# Patient Record
Sex: Male | Born: 1985 | Hispanic: Yes | Marital: Single | Smoking: Former smoker
Health system: Southern US, Community
[De-identification: ages and names within clinical notes are randomized; demographics above are authoritative.]

## PROBLEM LIST (undated history)

## (undated) DIAGNOSIS — N433 Hydrocele, unspecified: Secondary | ICD-10-CM

## (undated) HISTORY — DX: Hydrocele, unspecified: N43.3

---

## 2013-04-09 ENCOUNTER — Emergency Department: Payer: Self-pay | Admitting: Unknown Physician Specialty

## 2013-04-09 LAB — URINALYSIS, COMPLETE
Bacteria: NONE SEEN
Bilirubin,UR: NEGATIVE
Blood: NEGATIVE
Glucose,UR: NEGATIVE mg/dL (ref 0–75)
Ketone: NEGATIVE
Nitrite: NEGATIVE
RBC,UR: 1 /HPF (ref 0–5)
Specific Gravity: 1.016 (ref 1.003–1.030)

## 2013-04-09 LAB — GC/CHLAMYDIA PROBE AMP

## 2013-04-10 ENCOUNTER — Emergency Department: Payer: Self-pay | Admitting: Emergency Medicine

## 2013-04-10 LAB — URINE CULTURE

## 2013-09-24 ENCOUNTER — Emergency Department: Payer: Self-pay | Admitting: Emergency Medicine

## 2013-09-24 LAB — URINALYSIS, COMPLETE
Bilirubin,UR: NEGATIVE
Blood: NEGATIVE
Glucose,UR: NEGATIVE mg/dL (ref 0–75)
Ketone: NEGATIVE
Leukocyte Esterase: NEGATIVE
Nitrite: NEGATIVE
Protein: NEGATIVE
RBC,UR: <1 /HPF (ref 0–5)
Specific Gravity: 1.02 (ref 1.003–1.030)
Squamous Epithelial: NONE SEEN
WBC UR: 1 /HPF (ref 0–5)

## 2013-09-26 LAB — URINE CULTURE

## 2014-06-03 IMAGING — US US PELVIS LIMITED
1 series · 14 of 25 positions shown · non-contrast
Comparison: none

REASON FOR EXAM: testicular pain
COMMENTS:

[Series 1: us pelvis limited · 0.08mm/px · 14 of 60 slices shown]
[im 1/60]
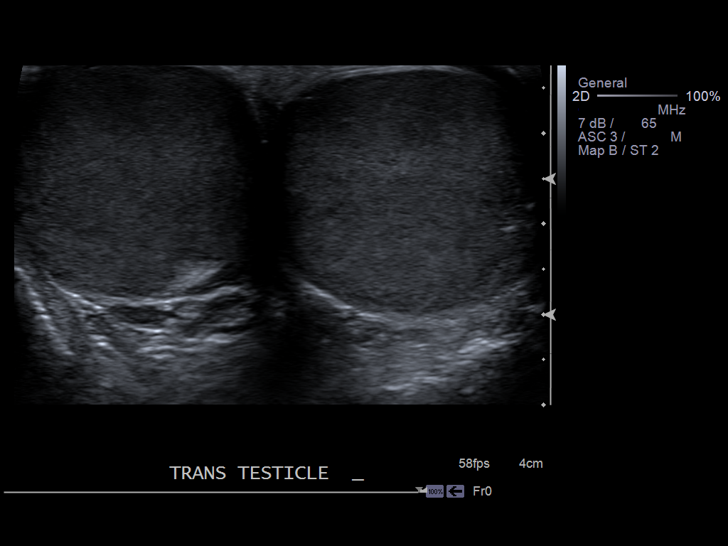
[im 5/60]
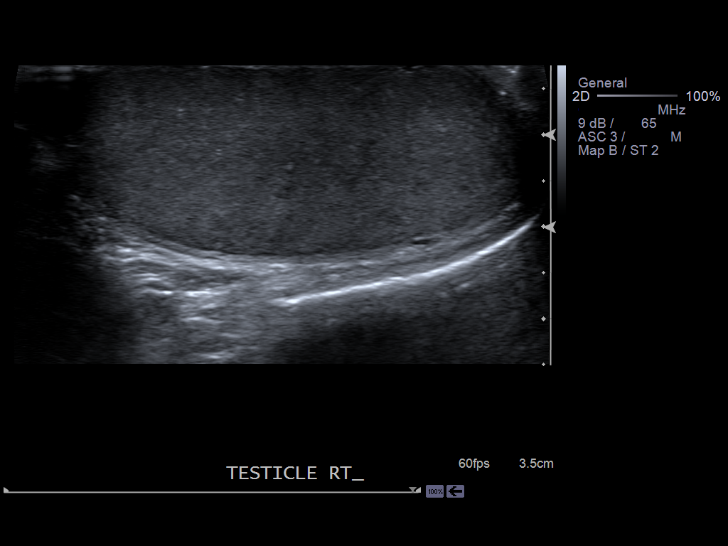
[im 10/60]
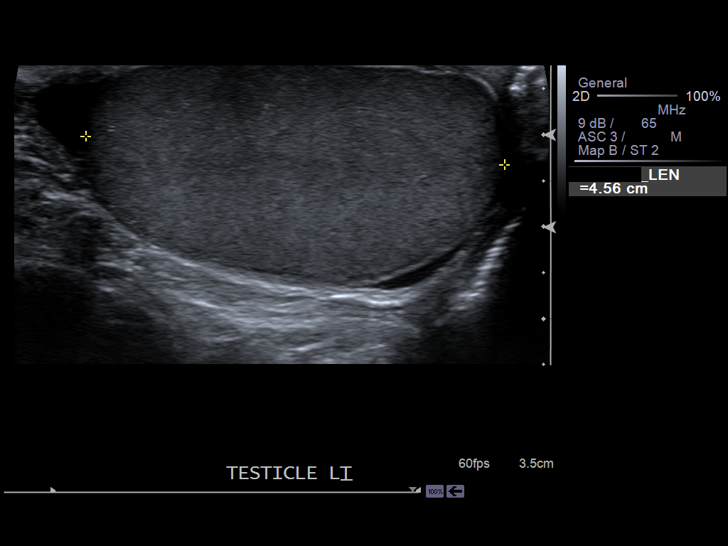
[im 15/60]
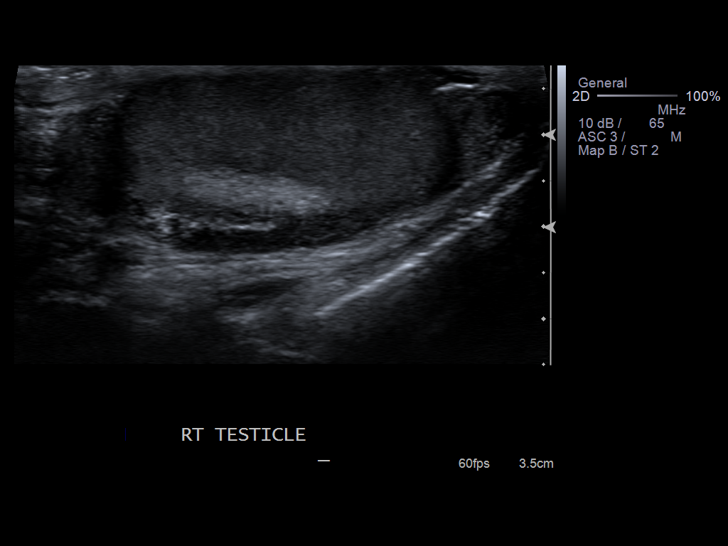
[im 20/60]
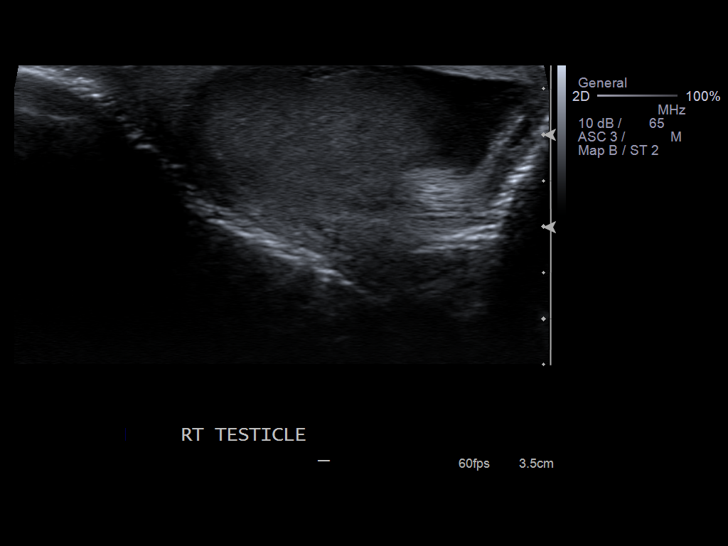
[im 23/60]
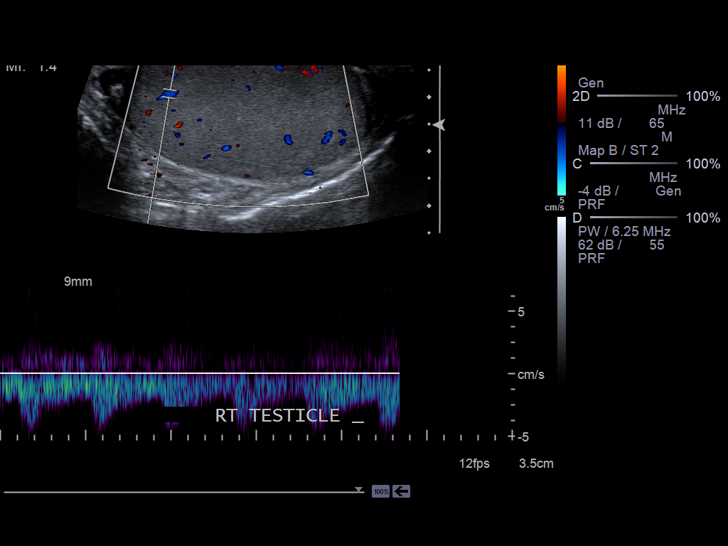
[im 28/60]
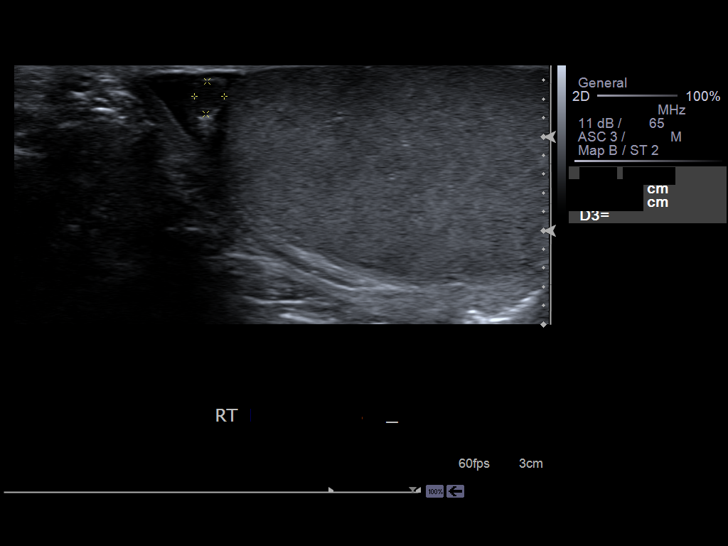
[im 32/60]
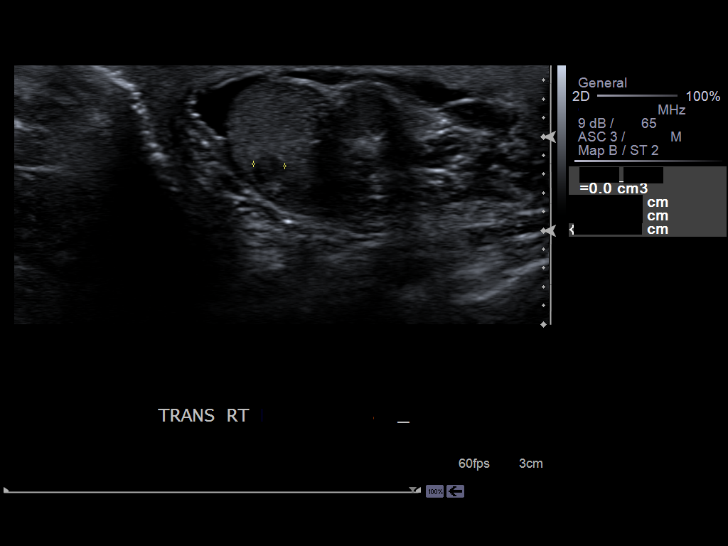
[im 37/60]
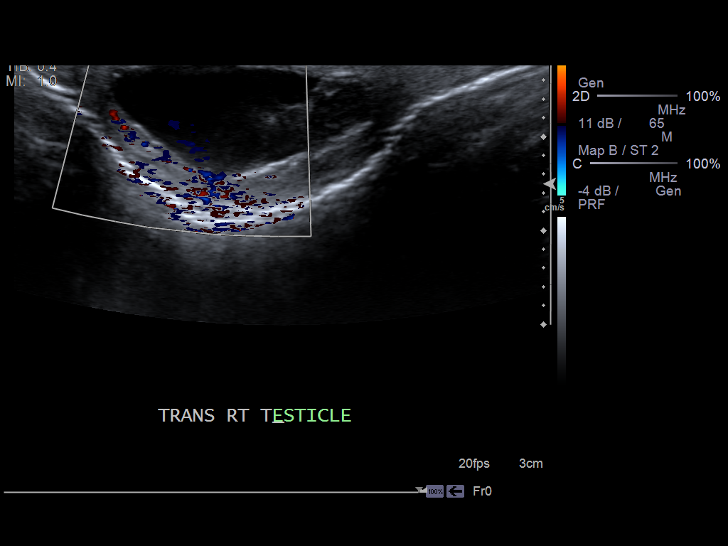
[im 40/60]
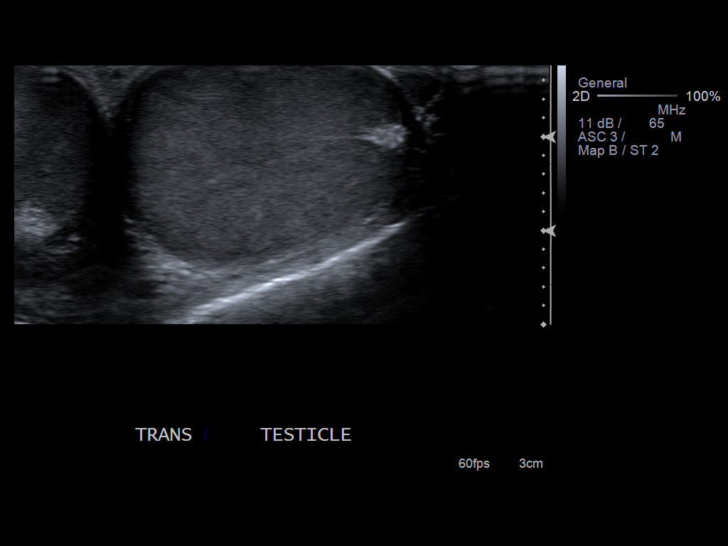
[im 45/60]
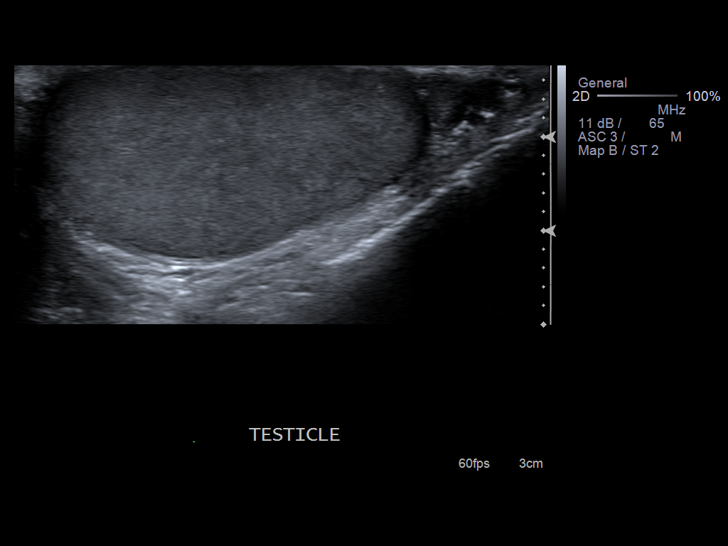
[im 50/60]
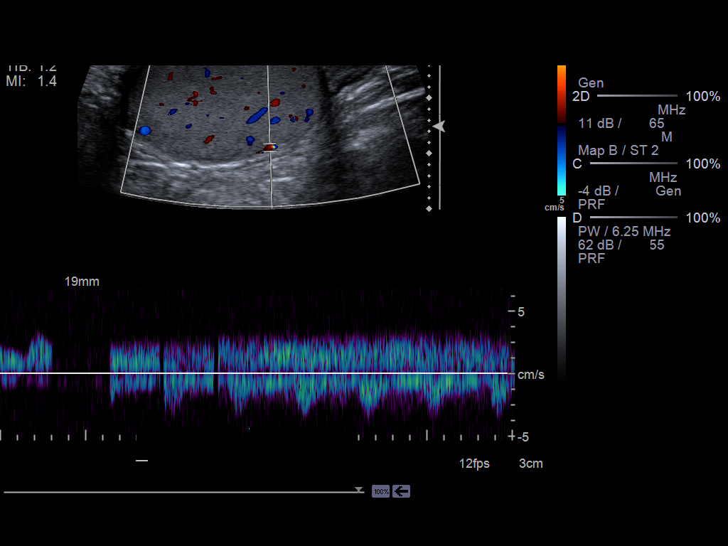
[im 55/60]
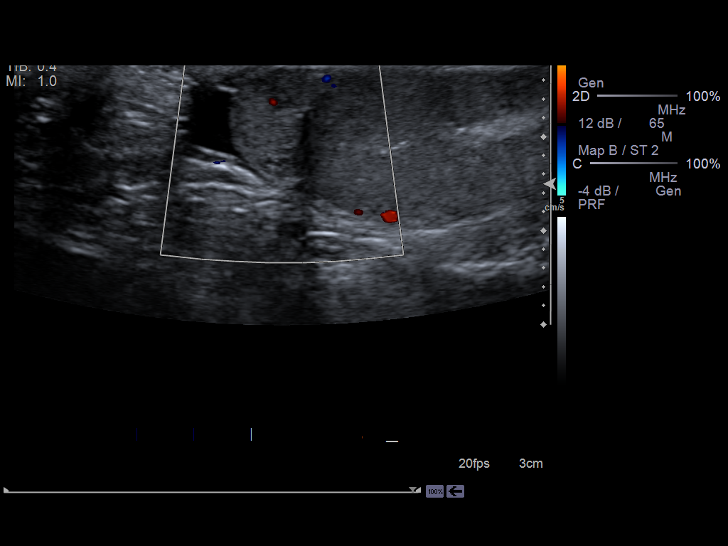
[im 60/60]
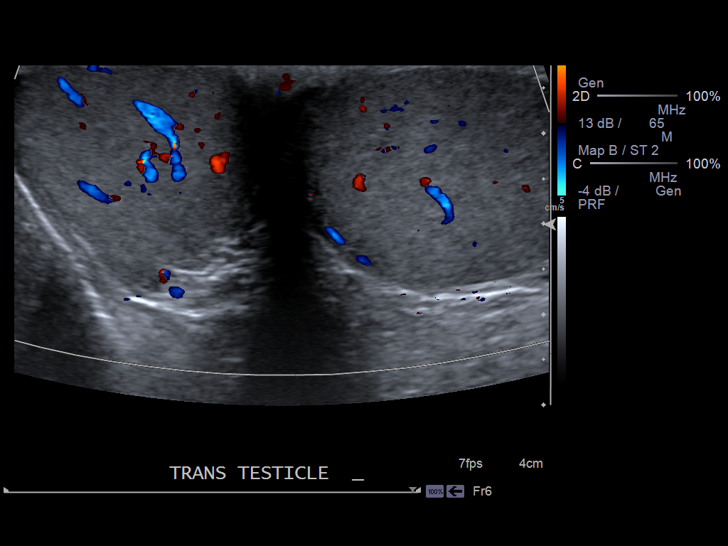

[14 of 25 positions shown; findings below may reference images not displayed]

PROCEDURE:     US  - US TESTICULAR  - April 10, 2013 [DATE]

RESULT:     Grayscale and color flow Doppler techniques were employed to
evaluate the testes.

The testes exhibit normal echotexture and vascularity. The right testicle
measures 4.7 x 2.4 x 2.9 cm. The left testicle measures 4.6 x 2.4 x 3 cm.
The epididymal structures exhibit normal vascularity. There is an
approximately 3 mm diameter epididymal cyst on the right. There are small
bilateral hydroceles.
IMPRESSION: 1. There is no evidence of testicular or epididymal torsion.
2. There are no findings to suggest acute epididymitis. There is a small
cyst associated with the right epididymis.
3. There are small hydroceles bilaterally.

[REDACTED]

## 2021-05-19 ENCOUNTER — Emergency Department
Admission: EM | Admit: 2021-05-19 | Discharge: 2021-05-19 | Disposition: A | Discharge status: Home / Self Care | Payer: Self-pay | Attending: Emergency Medicine | Admitting: Emergency Medicine

## 2021-05-19 ENCOUNTER — Encounter: Payer: Self-pay | Admitting: Emergency Medicine

## 2021-05-19 ENCOUNTER — Emergency Department: Payer: Self-pay

## 2021-05-19 ENCOUNTER — Other Ambulatory Visit: Payer: Self-pay

## 2021-05-19 DIAGNOSIS — N50811 Right testicular pain: Secondary | ICD-10-CM

## 2021-05-19 DIAGNOSIS — N433 Hydrocele, unspecified: Secondary | ICD-10-CM | POA: Insufficient documentation

## 2021-05-19 LAB — CHLAMYDIA/NGC RT PCR (ARMC ONLY)
Chlamydia Tr: NOT DETECTED
N gonorrhoeae: NOT DETECTED

## 2021-05-19 LAB — URINALYSIS, COMPLETE (UACMP) WITH MICROSCOPIC
Bacteria, UA: NONE SEEN
Bilirubin Urine: NEGATIVE
Glucose, UA: NEGATIVE mg/dL
Hgb urine dipstick: NEGATIVE
Ketones, ur: NEGATIVE mg/dL
Leukocytes,Ua: NEGATIVE
Nitrite: NEGATIVE
Protein, ur: NEGATIVE mg/dL
Specific Gravity, Urine: 1.014 (ref 1.005–1.030)
Squamous Epithelial / HPF: NONE SEEN (ref 0–5)
pH: 6 (ref 5.0–8.0)

## 2021-05-19 NOTE — ED Notes (Signed)
See triage note  States he developed some penile discharge about 1 week ago with some lower abd discomfort  No fever

## 2021-05-19 NOTE — ED Provider Notes (Signed)
[facility] Emergency Department Provider Note ____________________________________________  Time seen: 1100  I have reviewed the triage vital signs and the nursing notes.  HISTORY  Chief Complaint  Penile Discharge and SEXUALLY TRANSMITTED DISEASE   HPI Brendan Walsh is a 35 y.o. male presents to the ER today with complaint of penile discharge.  He reports this started 5 days ago.  He reports the discharge is white in color.  He does report some dysuria but denies urinary urgency, frequency, retention or blood in his urine.  He does have some lower abdominal pain intermittently but no low back pain.  He denies penile lesions.  He reports intermittent bilateral groin pain and intermittent testicular pain that seems worse with movement but no testicular swelling.  He denies fever, chills, body aches, nausea, vomiting or diarrhea.  He is concerned that he may have an STD because he had sexual intercourse with a new partner 1 week ago.  He reports she denied having any sexually transmitted diseases.  He has not tried anything OTC for his symptoms.  History reviewed. No pertinent past medical history.  There are no problems to display for this patient.   History reviewed. No pertinent surgical history.  Prior to Admission medications   Not on File    Allergies Patient has no allergy information on record.  No family history on file.  Social History    Review of Systems  Constitutional: Negative for fever, chills or body aches. ENT: Negative for sore throat. Cardiovascular: Negative for chest pain or chest tightness. Respiratory: Negative for cough or shortness of breath. Gastrointestinal: Positive for intermittent abdominal pain.  Negative for nausea, vomiting and diarrhea. Genitourinary: Positive for dysuria, intermittent testicular pain.  Negative for urgency, frequency, urinary retention. Musculoskeletal: Negative for back pain. Skin: Negative  for rash or lesion. Neurological: Negative for headaches, focal weakness, tingling or numbness. ____________________________________________  PHYSICAL EXAM:  VITAL SIGNS: ED Triage Vitals  Enc Vitals Group     BP 05/19/21 1058 (!) 148/102     Pulse Rate 05/19/21 1058 92     Resp 05/19/21 1058 18     Temp 05/19/21 1058 98.7 F (37.1 C)     Temp Source 05/19/21 1058 Oral     SpO2 05/19/21 1058 98 %     Weight 05/19/21 1048 120 lb (54.4 kg)     Height 05/19/21 1048 5\' 5"  (1.651 m)     Head Circumference --      Peak Flow --      Pain Score 05/19/21 1048 8     Pain Loc --      Pain Edu? --      Excl. in GC? --     Constitutional: Alert and oriented. Well appearing and in no distress. Head: Normocephalic Cardiovascular: Normal rate, regular rhythm.  Respiratory: Normal respiratory effort. No wheezes/rales/rhonchi. Gastrointestinal: Soft and nontender. No distention. GU: No femoral lymphadenopathy noted.  No pain with palpation of the testicles, no scrotal swelling noted. Neurologic: Normal speech and language. No gross focal neurologic deficits are appreciated. Skin:  Skin is warm, dry and intact. No rash or lesion noted.  ____________________________________________   LABS Labs Reviewed  URINALYSIS, COMPLETE (UACMP) WITH MICROSCOPIC - Abnormal; Notable for the following components:      Result Value   Color, Urine YELLOW (*)    APPearance CLEAR (*)    All other components within normal limits  CHLAMYDIA/NGC RT PCR ([facility] ONLY)  ______________________________________________  IMAGING Imaging Orders  US SCROTUM W/DOPPLER  IMPRESSION: Left hydrocele with associated debris which may be related to the chronicity of the hydrocele. No definite septation/complex hydrocele.  ____________________________________________  INITIAL IMPRESSION / ASSESSMENT AND PLAN / ED COURSE  Penile Discharger, Intermittent Lower Abdominal Pain, Dysuria, Intermittent Groin Pain,  Intermittent Testicular Pain:  DDx include STI, Acute Prostatitis, Epididymitis Urine gonorrhea and chlamydia negative Urinalysis normal US Scrotum shows hydrocele of left testicle Will have him follow up with urology as an outpatient ____________________________________________  FINAL CLINICAL IMPRESSION(S) / ED DIAGNOSES  Final diagnoses:  Hydrocele of testis     Lorre Munroe, NP 05/19/21 1545    Concha Se, MD 05/20/21 1100

## 2021-05-19 NOTE — ED Triage Notes (Signed)
Pt reports had sex with a girl and now has some pain and discharge in his penis and burning when he urinates.

## 2021-05-19 NOTE — Discharge Instructions (Addendum)
You were seen today for penile discharge, intermittent groin and testicular pain.  Your urine test did not show any signs of infection or STD.  Your ultrasound is consistent with a hydrocele.  You will need follow-up with urology as an outpatient for further evaluation and treatment of this.

## 2021-05-19 NOTE — ED Notes (Signed)
Urine sample sent to lab for urinalysis.

## 2021-05-19 NOTE — ED Notes (Signed)
In person Spanish interpreter, [place], used for discharge instructions.

## 2021-05-23 ENCOUNTER — Ambulatory Visit (INDEPENDENT_AMBULATORY_CARE_PROVIDER_SITE_OTHER): Payer: Self-pay | Admitting: Urology

## 2021-05-23 ENCOUNTER — Encounter: Payer: Self-pay | Admitting: Urology

## 2021-05-23 ENCOUNTER — Other Ambulatory Visit: Payer: Self-pay

## 2021-05-23 VITALS — BP 137/85 | HR 84 | Ht 65.0 in | Wt 156.0 lb

## 2021-05-23 DIAGNOSIS — N451 Epididymitis: Secondary | ICD-10-CM

## 2021-05-23 DIAGNOSIS — N5082 Scrotal pain: Secondary | ICD-10-CM

## 2021-05-23 MED ORDER — ETODOLAC ER 400 MG PO TB24: 400.0000 mg | ORAL_TABLET | Freq: Two times a day (BID) | ORAL | 3 refills | Status: DC | PRN

## 2021-05-23 MED ORDER — DOXYCYCLINE HYCLATE 100 MG PO TABS: 100.0000 mg | ORAL_TABLET | Freq: Two times a day (BID) | ORAL | 0 refills | Status: DC

## 2021-05-23 NOTE — Progress Notes (Signed)
   05/23/2021 5:33 PM   Brendan Walsh 1986/01/23 [id number]  Referring provider: No referring provider defined for this encounter.  Chief Complaint  Patient presents with   Hydrocele    HPI: Brendan Walsh is a 35 y.o. male presents for evaluation of recurrent scrotal pain.  A Spanish interpreter was utilized via video.  Presented to [facility] ED 05/19/2021 with a 5-day history of penile discharge and mild dysuria Reports a 5-year history of intermittent hemiscrotal pain.  He states the pain will occur on one side and then radiate to the contralateral side The side where the pain starts is variable Evaluation in the ED remarkable for a negative UA and negative GC/chlamydia Scrotal ultrasound showed normal-appearing testes bilaterally with normal paratesticular structures Small left hydrocele was noted   PMH: Past Medical History:  Diagnosis Date   Hydrocele, left     Surgical History: History reviewed. No pertinent surgical history.  Home Medications:  Allergies as of 05/23/2021   No Known Allergies      Medication List        Accurate as of May 23, 2021  5:33 PM. If you have any questions, ask your nurse or doctor.          doxycycline 100 MG tablet Commonly known as: VIBRA-TABS Take 1 tablet (100 mg total) by mouth 2 (two) times daily. Started by: Riki Altes, MD   etodolac 400 MG 24 hr tablet Commonly known as: LODINE XL Take 1 tablet (400 mg total) by mouth 2 (two) times daily as needed. Started by: Riki Altes, MD        Allergies: No Known Allergies  Family History: History reviewed. No pertinent family history.  Social History:  reports that he has quit smoking. His smoking use included cigarettes. He has never used smokeless tobacco. He reports current alcohol use. No history on file for drug use.   Physical Exam: BP 137/85   Pulse 84   Ht 5\' 5"  (1.651 m)   Wt 156 lb (70.8 kg)   BMI 25.96 kg/m   Constitutional:   Alert and oriented, No acute distress. HEENT: [place] AT, moist mucus membranes.  Trachea midline, no masses. Cardiovascular: No clubbing, cyanosis, or edema. Respiratory: Normal respiratory effort, no increased work of breathing. GI: Abdomen is soft, nontender, nondistended, no abdominal masses GU: Phallus without lesions, testes descended bilaterally without masses or tenderness.  There was prominence of the right epididymis with mild tenderness.  No palpable hernia   Laboratory Data:  Urinalysis Dipstick/microscopy negative  Assessment & Plan:    1.  Scrotal pain, chronic Mild enlargement and tenderness of the right epididymis Urinalysis unremarkable Empiric course of doxycycline 100 mg twice daily x3 weeks Rx etodolac 400 mg twice daily as needed Scrotal support PA follow-up 1 month for symptom reassessment   , MD  [facility] [address], [address] [place], [place] [phone]

## 2021-05-24 LAB — MICROSCOPIC EXAMINATION
Bacteria, UA: NONE SEEN
Epithelial Cells (non renal): NONE SEEN /hpf (ref 0–10)

## 2021-05-24 LAB — URINALYSIS, COMPLETE
Bilirubin, UA: NEGATIVE
Glucose, UA: NEGATIVE
Ketones, UA: NEGATIVE
Leukocytes,UA: NEGATIVE
Nitrite, UA: NEGATIVE
Protein,UA: NEGATIVE
RBC, UA: NEGATIVE
Specific Gravity, UA: >1.03 — ABNORMAL HIGH (ref 1.005–1.030)
Urobilinogen, Ur: 0.2 mg/dL (ref 0.2–1.0)
pH, UA: 5 (ref 5.0–7.5)

## 2021-06-24 ENCOUNTER — Encounter: Payer: Self-pay | Admitting: Physician Assistant

## 2021-06-24 ENCOUNTER — Other Ambulatory Visit: Payer: Self-pay

## 2021-06-24 ENCOUNTER — Ambulatory Visit (INDEPENDENT_AMBULATORY_CARE_PROVIDER_SITE_OTHER): Payer: Self-pay | Admitting: Physician Assistant

## 2021-06-24 VITALS — BP 161/94 | HR 67 | Ht 65.0 in | Wt 156.0 lb

## 2021-06-24 DIAGNOSIS — N5082 Scrotal pain: Secondary | ICD-10-CM

## 2021-06-24 MED ORDER — AMITRIPTYLINE HCL 25 MG PO TABS: ORAL_TABLET | ORAL | 0 refills | Status: AC

## 2021-06-24 NOTE — Progress Notes (Signed)
   06/24/2021 10:26 AM   Violet Baldy Iris Pert [place] Jack [birth year] [id number]  CC: Chief Complaint  Patient presents with   Follow-up   HPI: Brendan Walsh is a 35 y.o. male with PMH recurrent scrotal pain and left hydrocele who presents today for symptom recheck after having been treated with doxycycline x3 weeks and etodolac as needed for management of right epididymitis by Dr. Lonna Cobb.   Today he reports having taken doxycycline as prescribed as well as etodolac twice daily on a scheduled basis.  He states his right testicular pain initially got better after completion of antibiotics, however it has resumed.  He thinks the pain is improved with anti-inflammatories.  He denies ongoing dysuria or penile discharge.  He also denies fever, chills, nausea, and vomiting.  PMH: Past Medical History:  Diagnosis Date   Hydrocele, left     Surgical History: No past surgical history on file.  Home Medications:  Allergies as of 06/24/2021   No Known Allergies      Medication List        Accurate as of June 24, 2021 10:26 AM. If you have any questions, ask your nurse or doctor.          STOP taking these medications    doxycycline 100 MG tablet Commonly known as: VIBRA-TABS Stopped by: Carman Ching, PA-C       TAKE these medications    etodolac 400 MG 24 hr tablet Commonly known as: LODINE XL Take 1 tablet (400 mg total) by mouth 2 (two) times daily as needed.        Allergies:  No Known Allergies  Family History: No family history on file.  Social History:   reports that he has quit smoking. His smoking use included cigarettes. He has never used smokeless tobacco. He reports current alcohol use. No history on file for drug use.  Physical Exam: BP (!) 161/94   Pulse 67   Ht 5\' 5"  (1.651 m)   Wt 156 lb (70.8 kg)   BMI 25.96 kg/m   Constitutional:  Alert and oriented, no acute distress, nontoxic appearing HEENT: [place], AT Cardiovascular: No  clubbing, cyanosis, or edema Respiratory: Normal respiratory effort, no increased work of breathing GU: Bilateral descended testicles.  Noninflamed, nontender, nonenlarged bilateral epididymides. Skin: No rashes, bruises or suspicious lesions Neurologic: Grossly intact, no focal deficits, moving all 4 extremities Psychiatric: Normal mood and affect  Assessment & Plan:   1. Scrotal pain Inflamed, tender right epididymis has resolved after completion of empiric antibiotics.  We discussed that at this point it is unclear if his recurrent scrotal pain is associated with infection each time and that he should return to clinic with recurrent symptoms.  We discussed treatment options for his persistent discomfort including continued anti-inflammatories, pelvic floor PT, and Elavil.  Patient would like to try Elavil, which is reasonable.  Counseled him to continue etodolac as needed and not on a scheduled basis. - amitriptyline (ELAVIL) 25 MG tablet; Take 0.5 tablets (12.5 mg total) by mouth at bedtime for 8 days, THEN 1 tablet (25 mg total) at bedtime for 22 days.  Dispense: 26 tablet; Refill: 0   Return in about 6 weeks (around 08/05/2021) for symptom recheck.  08/07/2021, PA-C  [facility] [address], [address] [place], [place] [phone]

## 2021-08-05 ENCOUNTER — Ambulatory Visit: Payer: Self-pay | Admitting: Physician Assistant

## 2021-08-09 ENCOUNTER — Encounter: Payer: Self-pay | Admitting: Physician Assistant

## 2021-12-21 IMAGING — US US SCROTUM W/ DOPPLER COMPLETE
1 series · 14 of 25 positions shown · non-contrast
Comparison: None.

CLINICAL DATA: Pain in both testicles X 1 week.  Penile discharge.

EXAM:
SCROTAL ULTRASOUND
DOPPLER ULTRASOUND OF THE TESTICLES
TECHNIQUE: Complete ultrasound examination of the testicles, epididymis, and
other scrotal structures was performed. Color and spectral Doppler
ultrasound were also utilized to evaluate blood flow to the
testicles.

[Series 1: us scrotum w/doppler · 14 of 90 slices shown]
[im 1/90]
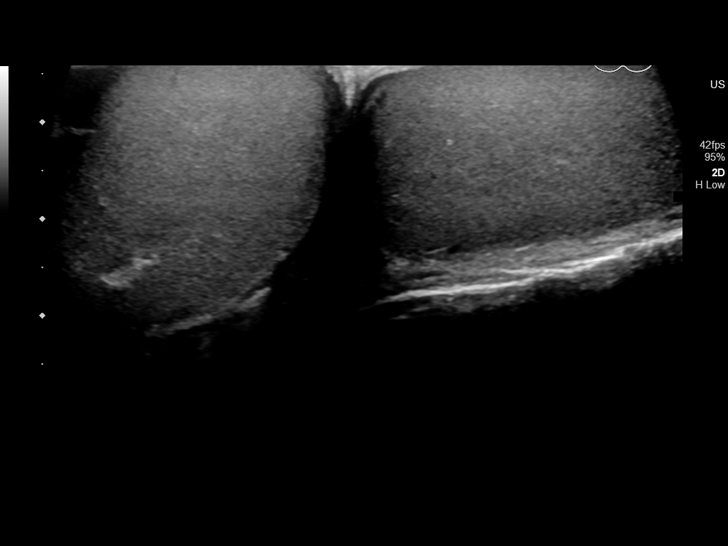
[im 8/90]
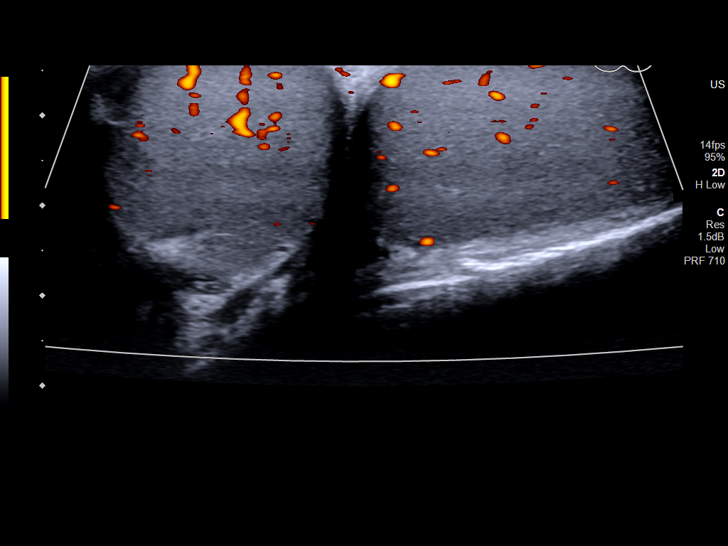
[im 15/90]
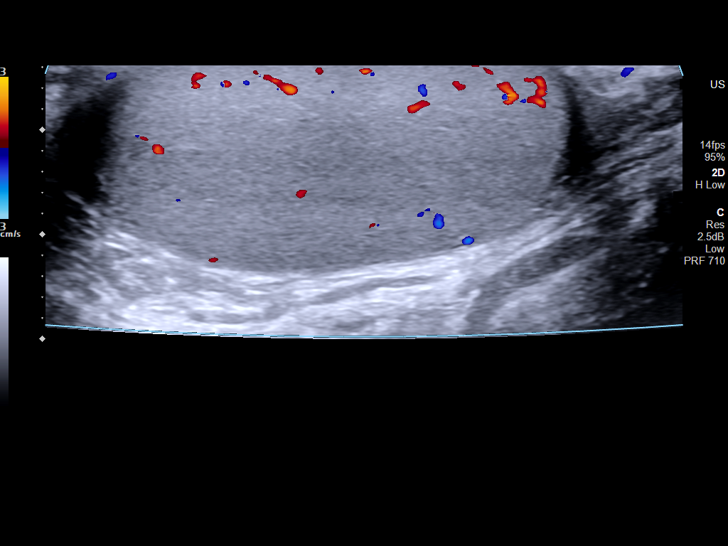
[im 23/90]
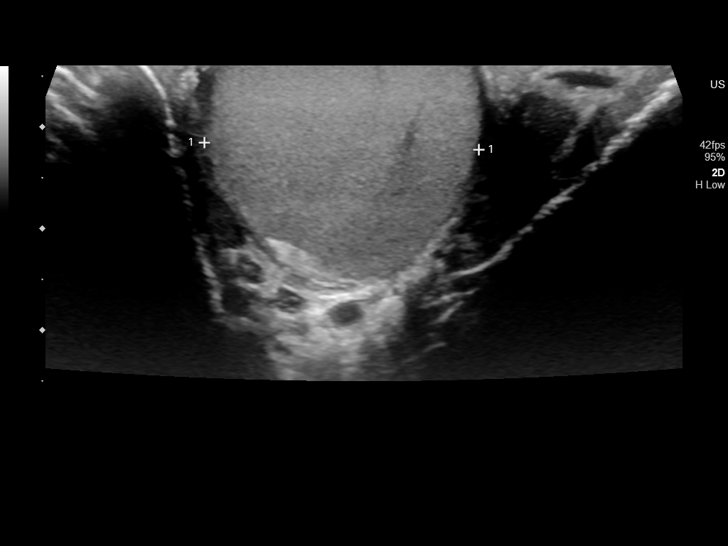
[im 30/90]
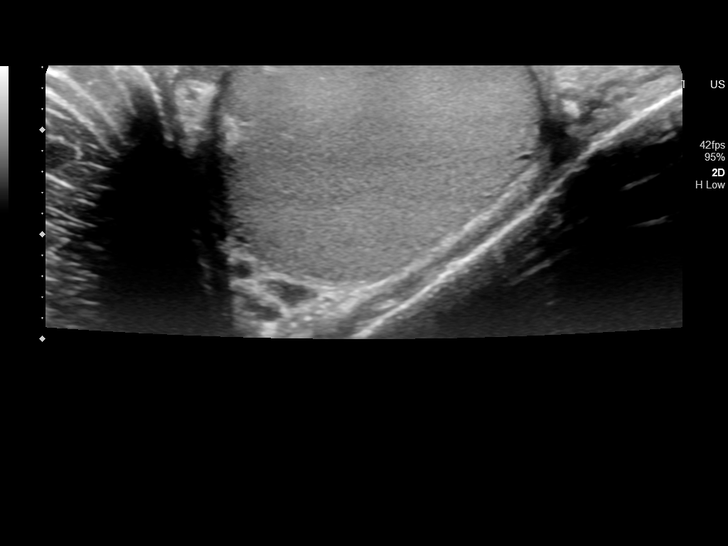
[im 34/90]
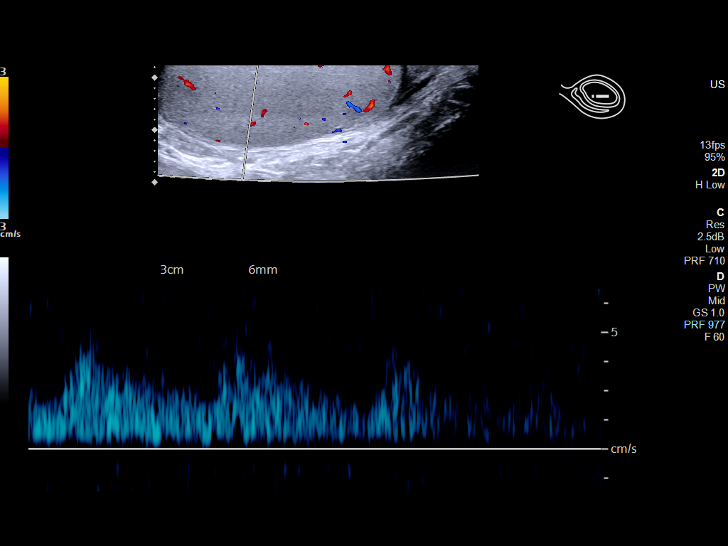
[im 41/90]
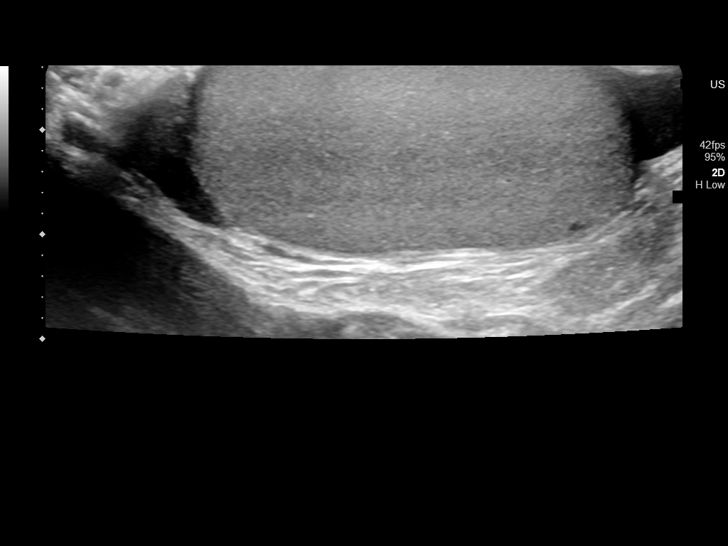
[im 49/90]
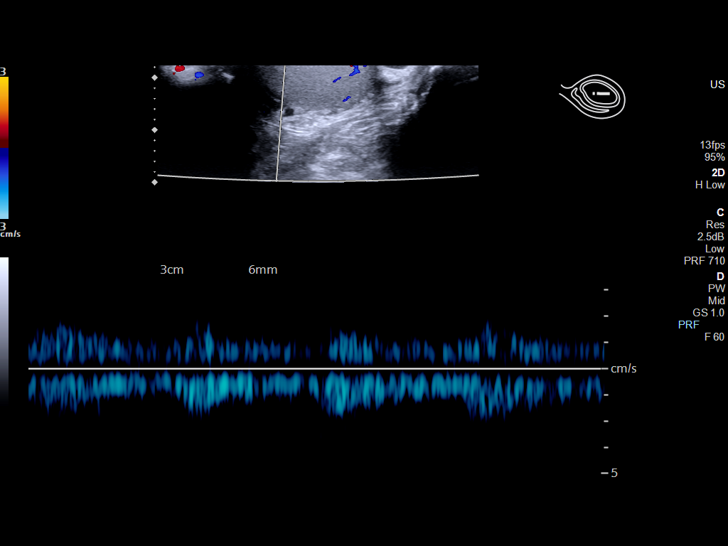
[im 56/90]
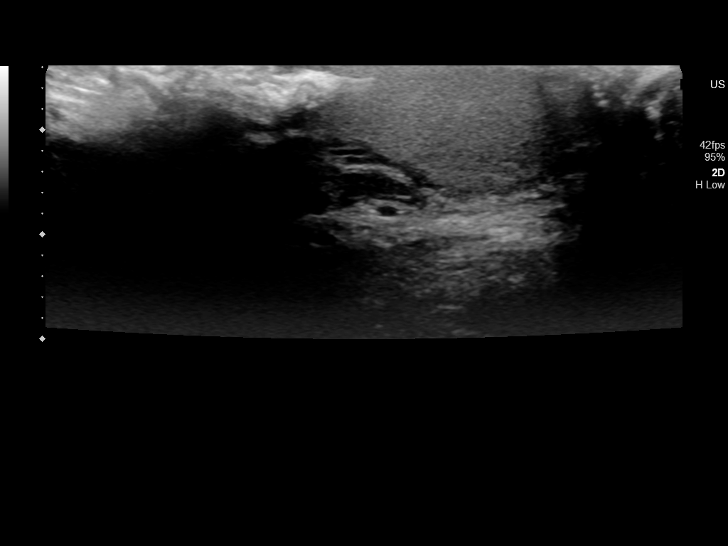
[im 60/90]
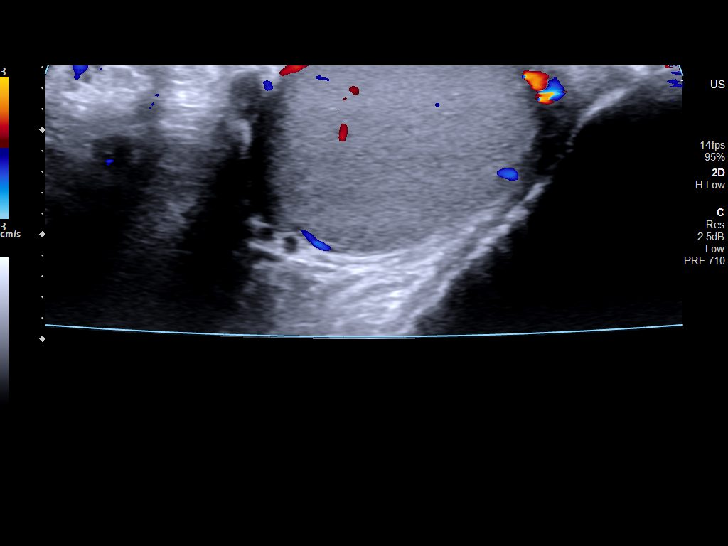
[im 67/90]
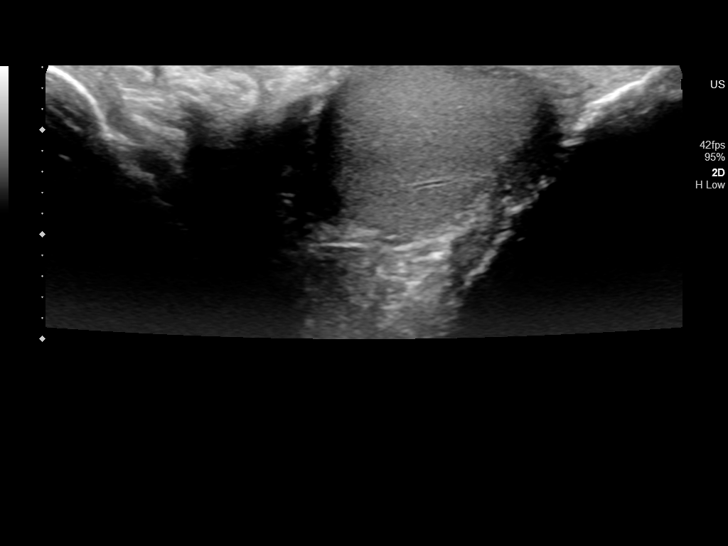
[im 75/90]
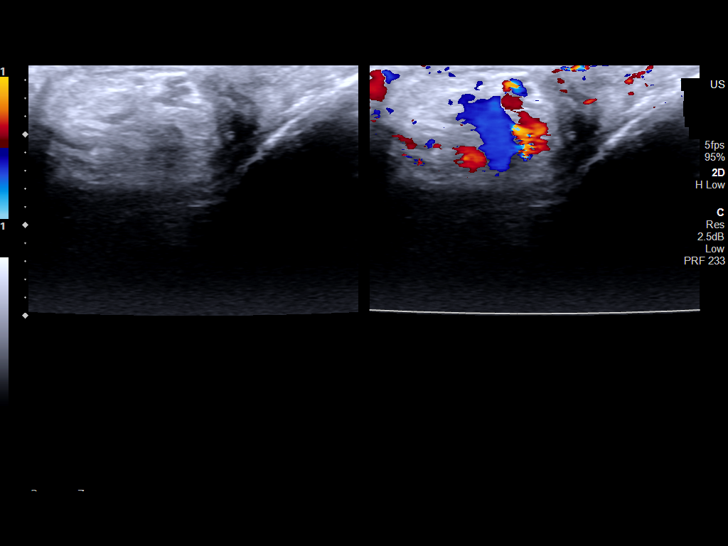
[im 82/90]
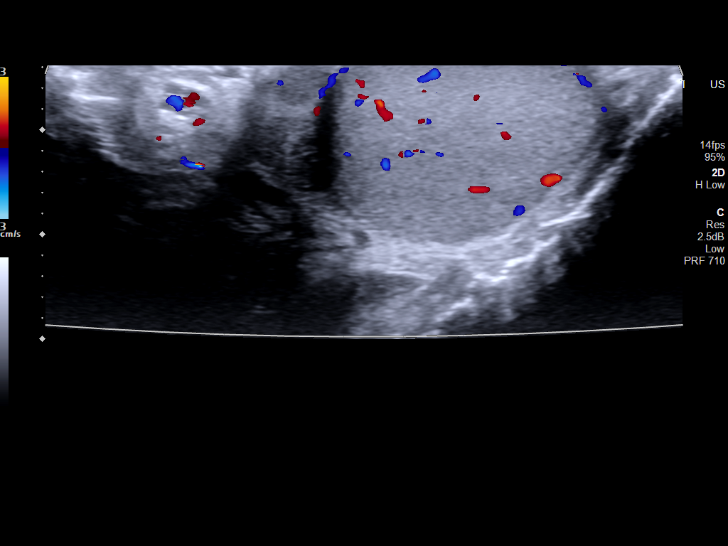
[im 90/90]
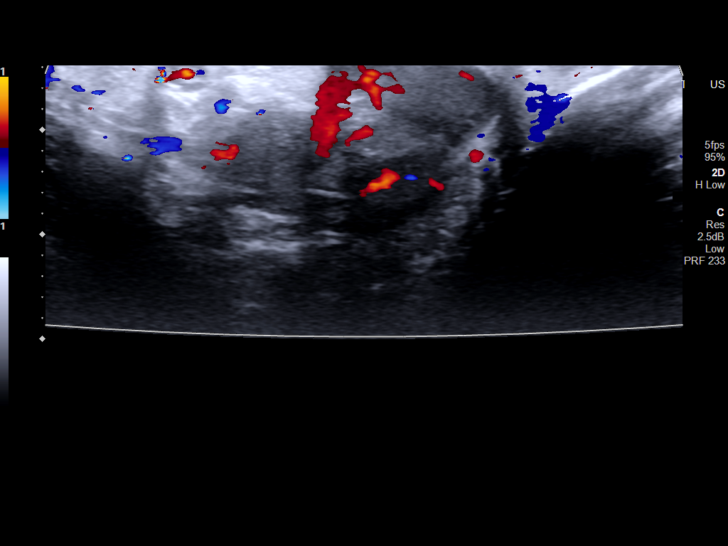

[14 of 25 positions shown; findings below may reference images not displayed]

FINDINGS: Right testicle

Measurements: 4.8 x 2.2 x 2.7 cm. No mass or microlithiasis
visualized.

Left testicle

Measurements: 4.3 x 1.9 x 2.6 cm. No mass or microlithiasis
visualized.

Right epididymis:  Normal in size and appearance.

Left epididymis:  Normal in size and appearance.

Hydrocele: Small left hydrocele with debris associated within. No
associated vascularity. No right hydrocele.

Varicocele:  None visualized.

Pulsed Doppler interrogation of both testes demonstrates normal low
resistance arterial and venous waveforms bilaterally.
IMPRESSION: Left hydrocele with associated debris which may be related to the
chronicity of the hydrocele. No definite septation/complex
hydrocele.

## 2022-06-30 ENCOUNTER — Encounter: Payer: Self-pay | Admitting: Emergency Medicine

## 2022-06-30 ENCOUNTER — Other Ambulatory Visit: Payer: Self-pay

## 2022-06-30 ENCOUNTER — Emergency Department
Admission: EM | Admit: 2022-06-30 | Discharge: 2022-06-30 | Disposition: A | Discharge status: Home / Self Care | Payer: Self-pay | Attending: Student in an Organized Health Care Education/Training Program | Admitting: Student in an Organized Health Care Education/Training Program

## 2022-06-30 DIAGNOSIS — R369 Urethral discharge, unspecified: Secondary | ICD-10-CM | POA: Insufficient documentation

## 2022-06-30 LAB — CHLAMYDIA/NGC RT PCR (ARMC ONLY)
Chlamydia Tr: NOT DETECTED
N gonorrhoeae: DETECTED — AB

## 2022-06-30 LAB — URINALYSIS, ROUTINE W REFLEX MICROSCOPIC
Bacteria, UA: NONE SEEN
Bilirubin Urine: NEGATIVE
Glucose, UA: NEGATIVE mg/dL
Hgb urine dipstick: NEGATIVE
Ketones, ur: NEGATIVE mg/dL
Nitrite: NEGATIVE
Protein, ur: 30 mg/dL — AB
Specific Gravity, Urine: 1.024 (ref 1.005–1.030)
WBC, UA: >50 WBC/hpf — ABNORMAL HIGH (ref 0–5)
pH: 5 (ref 5.0–8.0)

## 2022-06-30 MED ORDER — LIDOCAINE HCL (PF) 1 % IJ SOLN
5.0000 mL | Freq: Once | INTRAMUSCULAR | Status: AC
  Administered 2022-06-30: 5 mL
  Filled 2022-06-30: qty 5

## 2022-06-30 MED ORDER — CEFTRIAXONE SODIUM 1 G IJ SOLR
500.0000 mg | Freq: Once | INTRAMUSCULAR | Status: AC
  Administered 2022-06-30: 500 mg via INTRAMUSCULAR
  Filled 2022-06-30: qty 10

## 2022-06-30 MED ORDER — AZITHROMYCIN 500 MG PO TABS
2000.0000 mg | ORAL_TABLET | Freq: Once | ORAL | Status: AC
  Administered 2022-06-30: 2000 mg via ORAL
  Filled 2022-06-30: qty 4

## 2022-06-30 NOTE — ED Triage Notes (Signed)
Pt in with co penile discharge and burning on urination for 1 week.

## 2022-06-30 NOTE — ED Provider Notes (Signed)
[facility] Provider Note    Event Date/Time   First MD Initiated Contact with Patient 06/30/22 [phone]     (approximate)   History   Penile Discharge   HPI  Brendan Walsh is a 36 y.o. male who presents to the ER for evaluation of 1 week of progressively increasing dysuria and penile discharge.  He is sexually active with a woman is concerned that he has STD.  Denies any flank pain.  Does have some achiness in his testicles but no swelling no groin pain.  No nausea or vomiting.  No history of the same would like also to be tested for HIV.     Physical Exam   Triage Vital Signs: ED Triage Vitals [06/30/22 0919]  Enc Vitals Group     BP (!) 144/100     Pulse Rate 95     Resp 18     Temp 99.1 F (37.3 C)     Temp Source Oral     SpO2 100 %     Weight 171 lb 15.3 oz (78 kg)     Height 5\' 5"  (1.651 m)     Head Circumference      Peak Flow      Pain Score 8     Pain Loc      Pain Edu?      Excl. in GC?     Most recent vital signs: Vitals:   06/30/22 0919  BP: (!) 144/100  Pulse: 95  Resp: 18  Temp: 99.1 F (37.3 C)  SpO2: 100%     Constitutional: Alert  Eyes: Conjunctivae are normal.  Head: Atraumatic. Nose: No congestion/rhinnorhea. Mouth/Throat: Mucous membranes are moist.   Neck: Painless ROM.  Cardiovascular:   Good peripheral circulation. Respiratory: Normal respiratory effort.  No retractions.  Gastrointestinal: Soft and nontender.  Normal external genitalia no lesions does have yellow purulent discharge from the urethra Musculoskeletal:  no deformity Neurologic:  MAE spontaneously. No gross focal neurologic deficits are appreciated.  Skin:  Skin is warm, dry and intact. No rash noted. Psychiatric: Mood and affect are normal. Speech and behavior are normal.    ED Results / Procedures / Treatments   Labs (all labs ordered are listed, but only abnormal results are displayed) Labs Reviewed  CHLAMYDIA/NGC RT PCR  ([facility] ONLY)            URINALYSIS, ROUTINE W REFLEX MICROSCOPIC  HIV ANTIBODY (ROUTINE TESTING W REFLEX)     EKG     RADIOLOGY    PROCEDURES:  Critical Care performed:   Procedures   MEDICATIONS ORDERED IN ED: Medications  cefTRIAXone (ROCEPHIN) injection 500 mg (has no administration in time range)  azithromycin (ZITHROMAX) tablet 2,000 mg (has no administration in time range)  lidocaine (PF) (XYLOCAINE) 1 % injection 5 mL (has no administration in time range)     IMPRESSION / MDM / ASSESSMENT AND PLAN / ED COURSE  I reviewed the triage vital signs and the nursing notes.                              Differential diagnosis includes, but is not limited to, STI, urethritis, UTI, HIV  Patient presented to the ER for evaluation of symptoms consistent with STI as described above.  We will give empiric Rocephin and azithromycin.  Discussed return precautions will be tested for HIV per his request.  Will be given referral to  pelvic health department for follow-up.     FINAL CLINICAL IMPRESSION(S) / ED DIAGNOSES   Final diagnoses:  Penile discharge     Rx / DC Orders   ED Discharge Orders     None        Note:  This document was prepared using Dragon voice recognition software and may include unintentional dictation errors.    Willy Eddy, MD 06/30/22 1002

## 2022-07-01 ENCOUNTER — Telehealth: Payer: Self-pay | Admitting: Emergency Medicine

## 2022-07-01 LAB — HIV ANTIBODY (ROUTINE TESTING W REFLEX): HIV Screen 4th Generation wRfx: NONREACTIVE

## 2022-07-01 NOTE — Telephone Encounter (Signed)
Called patient to inform of gonorrhea result positive.  Also informed of negative hiv.  Questions  answered. Used interpretter [phone]

## 2022-11-22 ENCOUNTER — Emergency Department
Admission: EM | Admit: 2022-11-22 | Discharge: 2022-11-22 | Disposition: A | Discharge status: Home / Self Care | Payer: Self-pay | Attending: Emergency Medicine | Admitting: Emergency Medicine

## 2022-11-22 ENCOUNTER — Emergency Department: Payer: Self-pay

## 2022-11-22 ENCOUNTER — Other Ambulatory Visit: Payer: Self-pay

## 2022-11-22 DIAGNOSIS — Y92002 Bathroom of unspecified non-institutional (private) residence single-family (private) house as the place of occurrence of the external cause: Secondary | ICD-10-CM | POA: Insufficient documentation

## 2022-11-22 DIAGNOSIS — W182XXA Fall in (into) shower or empty bathtub, initial encounter: Secondary | ICD-10-CM | POA: Insufficient documentation

## 2022-11-22 DIAGNOSIS — Y92009 Unspecified place in unspecified non-institutional (private) residence as the place of occurrence of the external cause: Secondary | ICD-10-CM

## 2022-11-22 DIAGNOSIS — W19XXXA Unspecified fall, initial encounter: Secondary | ICD-10-CM

## 2022-11-22 DIAGNOSIS — S20211A Contusion of right front wall of thorax, initial encounter: Secondary | ICD-10-CM

## 2022-11-22 LAB — URINALYSIS, ROUTINE W REFLEX MICROSCOPIC
Bacteria, UA: NONE SEEN
Bilirubin Urine: NEGATIVE
Glucose, UA: NEGATIVE mg/dL
Hgb urine dipstick: NEGATIVE
Ketones, ur: NEGATIVE mg/dL
Leukocytes,Ua: NEGATIVE
Nitrite: NEGATIVE
Protein, ur: NEGATIVE mg/dL
Specific Gravity, Urine: 1.015 (ref 1.005–1.030)
Squamous Epithelial / HPF: NONE SEEN (ref 0–5)
pH: 8 (ref 5.0–8.0)

## 2022-11-22 MED ORDER — LIDOCAINE 5 % EX PTCH
1.0000 | MEDICATED_PATCH | CUTANEOUS | Status: DC
  Administered 2022-11-22: 1 via TRANSDERMAL
  Filled 2022-11-22: qty 1

## 2022-11-22 MED ORDER — KETOROLAC TROMETHAMINE 15 MG/ML IJ SOLN
15.0000 mg | Freq: Once | INTRAMUSCULAR | Status: AC
  Administered 2022-11-22: 15 mg via INTRAMUSCULAR
  Filled 2022-11-22: qty 1

## 2022-11-22 MED ORDER — LIDOCAINE 5 % EX PTCH: 1.0000 | MEDICATED_PATCH | Freq: Two times a day (BID) | CUTANEOUS | 0 refills | Status: AC

## 2022-11-22 NOTE — ED Provider Triage Note (Signed)
Emergency Medicine Provider Triage Evaluation Note  Brendan Walsh , a 36 y.o. male  was evaluated in triage.  Pt complains of right rib pain after fall yesterday in bathroom.Hit ribs on tub.    Review of Systems  Positive: Right rib pain.  Negative: No LOC, No hematuria.   Physical Exam  BP (!) 151/103 (BP Location: Left Arm)   Pulse 64   Temp 98.5 F (36.9 C) (Oral)   Resp 18   SpO2 98%  Gen:   Awake, no distress   Resp:  Normal effort   Lungs clear.  Right posterior lateral ribs tender to palpation with deformity noted MSK:   Moves extremities without difficulty  Other:    Medical Decision Making  Medically screening exam initiated at 1:56 PM.  Appropriate orders placed.  Brendan Walsh was informed that the remainder of the evaluation will be completed by another provider, this initial triage assessment does not replace that evaluation, and the importance of remaining in the ED until their evaluation is complete.     Tommi Rumps, PA-C 11/22/22 1359

## 2022-11-22 NOTE — ED Triage Notes (Signed)
Pt states he had a fall on Friday and his ribs hurt- pt fell in the bathroom and hit the tub- pt having pain in his back on the right side

## 2022-11-22 NOTE — ED Provider Notes (Signed)
[facility] Provider Note    Event Date/Time   First MD Initiated Contact with Patient 11/22/22 1441     (approximate)   History   Fall   HPI  Brendan Walsh is a 36 y.o. male who presents today for evaluation of right-sided rib pain after a slip and fall in the shower last night.  Patient reports that he hit the side of the tub with his right ribs.  There is no head strike or LOC.  He reports that he has had pain with any palpation of this area or truncal rotation.  He denies shortness of breath.  He denies abdominal pain.  Has not noticed any bruising.  He has not had any blood in his urine.  No headaches, nausea, vomiting, diarrhea, neck pain, or midline back pain.  He has not taken anything for his symptoms.  There are no problems to display for this patient.         Physical Exam   Triage Vital Signs: ED Triage Vitals  Enc Vitals Group     BP 11/22/22 1352 (!) 151/103     Pulse Rate 11/22/22 1352 64     Resp 11/22/22 1352 18     Temp 11/22/22 1352 98.5 F (36.9 C)     Temp Source 11/22/22 1352 Oral     SpO2 11/22/22 1352 98 %     Weight 11/22/22 1356 156 lb 8.4 oz (71 kg)     Height 11/22/22 1356 5\' 5"  (1.651 m)     Head Circumference --      Peak Flow --      Pain Score 11/22/22 1355 8     Pain Loc --      Pain Edu? --      Excl. in GC? --     Most recent vital signs: Vitals:   11/22/22 1352  BP: (!) 151/103  Pulse: 64  Resp: 18  Temp: 98.5 F (36.9 C)  SpO2: 98%    Physical Exam Vitals and nursing note reviewed.  Constitutional:      General: Awake and alert. No acute distress.    Appearance: Normal appearance. The patient is normal weight.  HENT:     Head: Normocephalic and atraumatic.     Mouth: Mucous membranes are moist.  Eyes:     General: PERRL. Normal EOMs        Right eye: No discharge.        Left eye: No discharge.     Conjunctiva/sclera: Conjunctivae normal.  Cardiovascular:     Rate and Rhythm:  Normal rate and regular rhythm.     Pulses: Normal pulses.  Pulmonary:     Effort: Pulmonary effort is normal. No respiratory distress.     Breath sounds: Normal breath sounds.  Mild tenderness palpation to right lateral rib cage without ecchymosis or other evidence of skin injury. Abdominal:     Abdomen is soft. There is no abdominal tenderness. No rebound or guarding. No distention. Musculoskeletal:        General: No swelling. Normal range of motion.     Cervical back: Normal range of motion and neck supple.  No C-spine, T-spine, or L-spine tenderness. Skin:    General: Skin is warm and dry.     Capillary Refill: Capillary refill takes less than 2 seconds.     Findings: No rash.  Neurological:     Mental Status: The patient is awake and alert.  ED Results / Procedures / Treatments   Labs (all labs ordered are listed, but only abnormal results are displayed) Labs Reviewed  URINALYSIS, ROUTINE W REFLEX MICROSCOPIC - Abnormal; Notable for the following components:      Result Value   Color, Urine YELLOW (*)    APPearance CLEAR (*)    All other components within normal limits     EKG     RADIOLOGY I independently reviewed and interpreted imaging and agree with radiologists findings.     PROCEDURES:  Critical Care performed:   Procedures   MEDICATIONS ORDERED IN ED: Medications  lidocaine (LIDODERM) 5 % 1 patch (1 patch Transdermal Patch Applied 11/22/22 1523)  ketorolac (TORADOL) 15 MG/ML injection 15 mg (15 mg Intramuscular Given 11/22/22 1522)     IMPRESSION / MDM / ASSESSMENT AND PLAN / ED COURSE  I reviewed the triage vital signs and the nursing notes.   Differential diagnosis includes, but is not limited to, contusion, rib fracture, costochondritis.  Patient is awake and alert, hemodynamically stable and afebrile.  X-ray rib series obtained in triage is negative for any acute findings.  Urinalysis also obtained is negative, no hematuria to suggest  intra-abdominal injury, no hemodynamic instability, no abdominal tenderness or ecchymosis, no flank or chest ecchymosis or swelling.  He was treated symptomatically with improvement of his symptoms.  We discussed return precautions and outpatient follow-up.  Patient understands and agrees with plan.   Patient's presentation is most consistent with acute complicated illness / injury requiring diagnostic workup.      FINAL CLINICAL IMPRESSION(S) / ED DIAGNOSES   Final diagnoses:  Fall in home, initial encounter  Rib contusion, right, initial encounter     Rx / DC Orders   ED Discharge Orders          Ordered    lidocaine (LIDODERM) 5 %  Every 12 hours        11/22/22 1512             Note:  This document was prepared using Dragon voice recognition software and may include unintentional dictation errors.   Keturah Shavers 11/22/22 1637    Pilar Jarvis, MD 11/22/22 Mikle Bosworth

## 2022-11-22 NOTE — Discharge Instructions (Addendum)
Your x-ray was normal.  You may continue to use the patches as needed for pain.  Please return for any new, worsening, or change in symptoms or other concerns.

## 2024-05-06 ENCOUNTER — Emergency Department
Admission: EM | Admit: 2024-05-06 | Discharge: 2024-05-07 | Disposition: A | Discharge status: Home / Self Care | Payer: Self-pay | Attending: Emergency Medicine | Admitting: Emergency Medicine

## 2024-05-06 ENCOUNTER — Emergency Department: Payer: Self-pay

## 2024-05-06 ENCOUNTER — Other Ambulatory Visit: Payer: Self-pay

## 2024-05-06 DIAGNOSIS — N50811 Right testicular pain: Secondary | ICD-10-CM | POA: Insufficient documentation

## 2024-05-06 DIAGNOSIS — N50812 Left testicular pain: Secondary | ICD-10-CM | POA: Insufficient documentation

## 2024-05-06 DIAGNOSIS — R7989 Other specified abnormal findings of blood chemistry: Secondary | ICD-10-CM | POA: Insufficient documentation

## 2024-05-06 LAB — CBC WITH DIFFERENTIAL/PLATELET
Abs Immature Granulocytes: 0.01 10*3/uL (ref 0.00–0.07)
Basophils Absolute: 0.1 10*3/uL (ref 0.0–0.1)
Basophils Relative: 1 %
Eosinophils Absolute: 0.1 10*3/uL (ref 0.0–0.5)
Eosinophils Relative: 2 %
HCT: 41.8 % (ref 39.0–52.0)
Hemoglobin: 14.4 g/dL (ref 13.0–17.0)
Immature Granulocytes: 0 %
Lymphocytes Relative: 34 %
Lymphs Abs: 1.8 10*3/uL (ref 0.7–4.0)
MCH: 29.3 pg (ref 26.0–34.0)
MCHC: 34.4 g/dL (ref 30.0–36.0)
MCV: 85.1 fL (ref 80.0–100.0)
Monocytes Absolute: 1 10*3/uL (ref 0.1–1.0)
Monocytes Relative: 18 %
Neutro Abs: 2.4 10*3/uL (ref 1.7–7.7)
Neutrophils Relative %: 45 %
Platelets: 167 10*3/uL (ref 150–400)
RBC: 4.91 MIL/uL (ref 4.22–5.81)
RDW: 12.8 % (ref 11.5–15.5)
WBC: 5.3 10*3/uL (ref 4.0–10.5)
nRBC: 0 % (ref 0.0–0.2)

## 2024-05-06 LAB — URINALYSIS, ROUTINE W REFLEX MICROSCOPIC
Bilirubin Urine: NEGATIVE
Glucose, UA: NEGATIVE mg/dL
Hgb urine dipstick: NEGATIVE
Ketones, ur: NEGATIVE mg/dL
Leukocytes,Ua: NEGATIVE
Nitrite: NEGATIVE
Protein, ur: NEGATIVE mg/dL
Specific Gravity, Urine: 1.025 (ref 1.005–1.030)
pH: 6 (ref 5.0–8.0)

## 2024-05-06 LAB — COMPREHENSIVE METABOLIC PANEL WITH GFR
ALT: 480 U/L — ABNORMAL HIGH (ref 0–44)
AST: 459 U/L — ABNORMAL HIGH (ref 15–41)
Albumin: 4.3 g/dL (ref 3.5–5.0)
Alkaline Phosphatase: 94 U/L (ref 38–126)
Anion gap: 7 (ref 5–15)
BUN: 17 mg/dL (ref 6–20)
CO2: 23 mmol/L (ref 22–32)
Calcium: 8.7 mg/dL — ABNORMAL LOW (ref 8.9–10.3)
Chloride: 106 mmol/L (ref 98–111)
Creatinine, Ser: 0.83 mg/dL (ref 0.61–1.24)
GFR, Estimated: >60 mL/min (ref >60)
Glucose, Bld: 102 mg/dL — ABNORMAL HIGH (ref 70–99)
Potassium: 3.4 mmol/L — ABNORMAL LOW (ref 3.5–5.1)
Sodium: 136 mmol/L (ref 135–145)
Total Bilirubin: 0.9 mg/dL (ref 0.0–1.2)
Total Protein: 7.9 g/dL (ref 6.5–8.1)

## 2024-05-06 LAB — CHLAMYDIA/NGC RT PCR (ARMC ONLY)
Chlamydia Tr: NOT DETECTED
N gonorrhoeae: NOT DETECTED

## 2024-05-06 MED ORDER — IBUPROFEN 800 MG PO TABS
800.0000 mg | ORAL_TABLET | Freq: Once | ORAL | Status: AC
  Administered 2024-05-06: 800 mg via ORAL
  Filled 2024-05-06: qty 1

## 2024-05-06 NOTE — ED Provider Notes (Signed)
 [facility] Provider Note    Event Date/Time   First MD Initiated Contact with Patient 05/06/24 2302     (approximate)   History   Testicle Pain and Dysuria   HPI  Brendan Walsh is a 38 y.o. male with history of regular alcohol use who presents to the emergency department with complaints of bilateral intermittent testicular pain over the past 4 days.  Also has had some dysuria but no hematuria or discharge.  No injury to this area.  No swelling.  Also has some lower abdominal discomfort.  No prior abdominal surgeries, history of kidney stones.  No fevers, vomiting, diarrhea.   History provided by patient.    Past Medical History:  Diagnosis Date   Hydrocele, left     History reviewed. No pertinent surgical history.  MEDICATIONS:  Prior to Admission medications   Medication Sig Start Date End Date Taking? Authorizing Provider  amitriptyline  (ELAVIL ) 25 MG tablet Take 0.5 tablets (12.5 mg total) by mouth at bedtime for 8 days, THEN 1 tablet (25 mg total) at bedtime for 22 days. 06/24/21 07/24/21  Vaillancourt, Samantha, PA-C  etodolac  (LODINE  XL) 400 MG 24 hr tablet Take 1 tablet (400 mg total) by mouth 2 (two) times daily as needed. 05/23/21   Geraline Knapp, MD    Physical Exam   Triage Vital Signs: ED Triage Vitals  Encounter Vitals Group     BP 05/06/24 2115 (!) 155/95     Systolic BP Percentile --      Diastolic BP Percentile --      Pulse Rate 05/06/24 2115 89     Resp 05/06/24 2115 18     Temp 05/06/24 2115 98.5 F (36.9 C)     Temp Source 05/06/24 2115 Oral     SpO2 05/06/24 2115 95 %     Weight --      Height --      Head Circumference --      Peak Flow --      Pain Score 05/06/24 2116 8     Pain Loc --      Pain Education --      Exclude from Growth Chart --     Most recent vital signs: Vitals:   05/06/24 2115 05/07/24 0217  BP: (!) 155/95 (!) 140/77  Pulse: 89 80  Resp: 18 18  Temp: 98.5 F (36.9 C) 98.2 F  (36.8 C)  SpO2: 95%     CONSTITUTIONAL: Alert, responds appropriately to questions. Well-appearing; well-nourished HEAD: Normocephalic, atraumatic EYES: Conjunctivae clear, pupils appear equal, sclera nonicteric ENT: normal nose; moist mucous membranes NECK: Supple, normal ROM CARD: RRR; S1 and S2 appreciated RESP: Normal chest excursion without splinting or tachypnea; breath sounds clear and equal bilaterally; no wheezes, no rhonchi, no rales, no hypoxia or respiratory distress, speaking full sentences ABD/GI: Non-distended; soft, tender throughout the lower abdomen without guarding or rebound GU:  Normal external genitalia, circumcised male, normal penile shaft, no blood or discharge at the urethral meatus, no testicular masses or tenderness on exam, no scrotal masses or swelling, no hernias appreciated, 2+ femoral pulses bilaterally; no perineal erythema, warmth, subcutaneous air or crepitus; no high riding testicle, normal bilateral cremasteric reflex.  Chaperone present for exam. BACK: The back appears normal EXT: Normal ROM in all joints; no deformity noted, no edema SKIN: Normal color for age and race; warm; no rash on exposed skin NEURO: Moves all extremities equally, normal speech PSYCH: The patient's mood  and manner are appropriate.   ED Results / Procedures / Treatments   LABS: (all labs ordered are listed, but only abnormal results are displayed) Labs Reviewed  URINALYSIS, ROUTINE W REFLEX MICROSCOPIC - Abnormal; Notable for the following components:      Result Value   Color, Urine YELLOW (*)    APPearance CLEAR (*)    All other components within normal limits  COMPREHENSIVE METABOLIC PANEL WITH GFR - Abnormal; Notable for the following components:   Potassium 3.4 (*)    Glucose, Bld 102 (*)    Calcium 8.7 (*)    AST 459 (*)    ALT 480 (*)    All other components within normal limits  CHLAMYDIA/NGC RT PCR ([facility] ONLY)            CBC WITH DIFFERENTIAL/PLATELET   LIPASE, BLOOD  HEPATITIS PANEL, ACUTE     EKG:  EKG Interpretation Date/Time:    Ventricular Rate:    PR Interval:    QRS Duration:    QT Interval:    QTC Calculation:   R Axis:      Text Interpretation:           RADIOLOGY: My personal review and interpretation of imaging: CT scan shows no acute abnormality.  Scrotal Doppler shows no torsion.  I have personally reviewed all radiology reports.   CT ABDOMEN PELVIS WO CONTRAST Result Date: 05/06/2024 CLINICAL DATA:  Painful urination.  Abdominal and flank pain. EXAM: CT ABDOMEN AND PELVIS WITHOUT CONTRAST TECHNIQUE: Multidetector CT imaging of the abdomen and pelvis was performed following the standard protocol without IV contrast. RADIATION DOSE REDUCTION: This exam was performed according to the departmental dose-optimization program which includes automated exposure control, adjustment of the mA and/or kV according to patient size and/or use of iterative reconstruction technique. COMPARISON:  None Available. FINDINGS: Lower chest: No acute abnormality. Hepatobiliary: No focal liver abnormality is seen. No gallstones, gallbladder wall thickening, or biliary dilatation. Pancreas: Unremarkable. No pancreatic ductal dilatation or surrounding inflammatory changes. Spleen: Normal in size without focal abnormality. Adrenals/Urinary Tract: Adrenal glands are unremarkable. Kidneys are normal, without renal calculi, focal lesion, or hydronephrosis. Bladder is unremarkable. Stomach/Bowel: Stomach is within normal limits. Appendix appears normal. No evidence of bowel wall thickening, distention, or inflammatory changes. There are scattered colonic diverticula. Vascular/Lymphatic: No significant vascular findings are present. No enlarged abdominal or pelvic lymph nodes. Reproductive: Prostate is unremarkable. Other: No abdominal wall hernia or abnormality. No abdominopelvic ascites. Musculoskeletal: No fracture is seen. IMPRESSION: 1. No acute  localizing process in the abdomen or pelvis. 2. Colonic diverticulosis without evidence for diverticulitis. Electronically Signed   By: Tyron Gallon M.D.   On: 05/06/2024 23:45   US  SCROTUM W/DOPPLER Result Date: 05/06/2024 EXAM: ULTRASOUND SCROTUM/TESTICLES WITH DOPPLER FLOW EVALUATION 05/06/2024 11:10:16 PM TECHNIQUE: Duplex ultrasound using B-mode/gray scaled imaging, Doppler spectral analysis and color flow Doppler was obtained of the testicles. COMPARISON: None available. CLINICAL HISTORY: Testicle pain. FINDINGS: RIGHT: MEASUREMENTS: Right testis measures 4.8 x 2.5 x 3.4 cm. GREY SCALE: The right testicle demonstrates normal homogeneous echotexture without focal lesion. No testicular microlithiasis. DOPPLER EVALUATION: There is normal arterial and venous Doppler flow within the testicle. VARIOCELES: No scrotal varicoceles. SCROTAL SAC: Small right hydrocele. EPIDIDYMIS: No acute abnormality. LEFT: MEASUREMENTS: Left testis measures 4.3 x 2.2 x 3.4 cm. GREY SCALE: The left testicle demonstrates normal homogeneous echotexture without focal lesion. No testicular microlithiasis. DOPPLER EVALUATION: There is normal arterial and venous Doppler flow within the testicle. VARIOCELES: No  scrotal varicoceles. SCROTAL SAC: Small left hydrocele. EPIDIDYMIS: No acute abnormality. IMPRESSION: 1. No evidence of testicular torsion. 2. Small bilateral hydroceles. Electronically signed by: Zadie Herter MD 05/06/2024 11:14 PM EDT RP Workstation: [id number]     PROCEDURES:  Critical Care performed: No      Procedures    IMPRESSION / MDM / ASSESSMENT AND PLAN / ED COURSE  I reviewed the triage vital signs and the nursing notes.    Patient here with bilateral intermittent testicular pain x 4 days.  Also with lower abdominal discomfort.  The patient is on the cardiac monitor to evaluate for evidence of arrhythmia and/or significant heart rate changes.   DIFFERENTIAL DIAGNOSIS (includes but not  limited to):   Orchitis, epididymitis, torsion, STI, UTI, appendicitis, kidney stone, pyelonephritis, diverticulitis   Patient's presentation is most consistent with acute presentation with potential threat to life or bodily function.   PLAN: Urine shows no sign of infection.  Gonorrhea and Chlamydia negative.  Ultrasound obtained immediately after patient's arrival and reviewed/interpreted by myself and the radiologist and shows normal blood flow to both testicles.  He has small bilateral hydroceles without other acute abnormality.  Will obtain labs, CT of the abdomen pelvis given he does have lower abdominal tenderness.  Will give ibuprofen  for pain here.   MEDICATIONS GIVEN IN ED: Medications  ibuprofen  (ADVIL ) tablet 800 mg (800 mg Oral Given 05/06/24 2324)  levofloxacin (LEVAQUIN) tablet 500 mg (500 mg Oral Given 05/07/24 0215)     ED COURSE: Labs show elevation of his AST, ALT but normal alkaline phosphatase, total bilirubin, lipase.  He has no upper abdominal tenderness today.  He does report drinking alcohol every day but he states it is only 1 drink a day.  Denies Tylenol use.  Denies history of hepatitis.  States he has never been told his liver function tests are elevated.  CT scan reviewed and interpreted by myself and the radiologist and shows no acute abnormality.  Normal appendix.  Liver, gallbladder also appeared normal.  Will send hepatitis screening panel but have advised patient to avoid ethanol, Tylenol and eat a low-fat diet.  Will refer to gastroenterology for transaminitis.  I have no old labs to compare this to.   Given bilateral testicular discomfort with dysuria, will treat for orchitis, epididymitis not seen on imaging today with Levaquin.  Recommended ibuprofen  as needed for pain.  Have also placed a PCP referral.   At this time, I do not feel there is any life-threatening condition present. I reviewed all nursing notes, vitals, pertinent previous records.  All lab  and urine results, EKGs, imaging ordered have been independently reviewed and interpreted by myself.  I reviewed all available radiology reports from any imaging ordered this visit.  Based on my assessment, I feel the patient is safe to be discharged home without further emergent workup and can continue workup as an outpatient as needed. Discussed all findings, treatment plan as well as usual and customary return precautions.  They verbalize understanding and are comfortable with this plan.  Outpatient follow-up has been provided as needed.  All questions have been answered.    CONSULTS:  none   OUTSIDE RECORDS REVIEWED: Patient previously seen by urology in June 2022 for scrotal pain.       FINAL CLINICAL IMPRESSION(S) / ED DIAGNOSES   Final diagnoses:  Pain in both testicles  Elevated liver function tests     Rx / DC Orders   ED Discharge Orders  Ordered    Ambulatory Referral to Primary Care (Establish Care)        05/07/24 0158    levofloxacin (LEVAQUIN) 500 MG tablet  Daily        05/07/24 0206    ibuprofen  (ADVIL ) 800 MG tablet  Every 8 hours PRN        05/07/24 0206             Note:  This document was prepared using Dragon voice recognition software and may include unintentional dictation errors.   Swed[place] Lesure, Clover Dao, DO 05/07/24 [phone]

## 2024-05-06 NOTE — ED Triage Notes (Signed)
 Pt to ED via POV c/o testicular pain and painful urination x4 days. Reports pain moves from left to right testicle. Having pain to right at this time. Endorses some swelling. Reports urine is dark and cloudy. Denies fevers.

## 2024-05-07 LAB — HEPATITIS PANEL, ACUTE
HCV Ab: REACTIVE — AB
Hep A IgM: NONREACTIVE
Hep B C IgM: NONREACTIVE
Hepatitis B Surface Ag: NONREACTIVE

## 2024-05-07 LAB — LIPASE, BLOOD: Lipase: 31 U/L (ref 11–51)

## 2024-05-07 MED ORDER — LEVOFLOXACIN 500 MG PO TABS
500.0000 mg | ORAL_TABLET | Freq: Once | ORAL | Status: AC
  Administered 2024-05-07: 500 mg via ORAL
  Filled 2024-05-07: qty 1

## 2024-05-07 MED ORDER — LEVOFLOXACIN 500 MG PO TABS: 500.0000 mg | ORAL_TABLET | Freq: Every day | ORAL | 0 refills | Status: AC

## 2024-05-07 MED ORDER — IBUPROFEN 800 MG PO TABS
800.0000 mg | ORAL_TABLET | Freq: Three times a day (TID) | ORAL | 0 refills | Status: AC | PRN
Start: 2024-05-07 — End: ?

## 2024-05-07 NOTE — Discharge Instructions (Addendum)
 Your testicular ultrasound and CT scan showed no abnormality today.  Your urine showed no sign of infection and your gonorrhea and Chlamydia tests were negative.  I am starting you on antibiotics so for possible orchitis which is infection of your testicle that is some times not seen on ultrasound or CT scan.  Please take until complete.  Your AST, ALT which are liver function test were elevated today.  I recommend avoiding alcohol, Tylenol and eating a low-fat diet.  We have provided you with information to follow-up with gastroenterology for this.  Your liver and gallbladder appeared normal on CT scan but there are further test such as ultrasounds and further blood work that may need to be done as an outpatient.   Su ecografa testicular y tomografa computarizada (TC) hoy no mostraron ninguna anomala. Su orina no mostr signos de infeccin y sus pruebas de gonorrea y clamidia dieron negativo. Voy a iniciar el tratamiento con antibiticos para una posible orquitis, una infeccin del testculo que a veces no se detecta en la ecografa ni en la tomografa computarizada. Por favor, tmelos hasta que se completen.  Sus niveles de AST y ALT, que son valores de funcin heptica, estaban elevados hoy. Le recomiendo evitar el alcohol y el Tylenol, y seguir una dieta baja en grasas. Le hemos proporcionado informacin para que consulte con gastroenterologa por este motivo. Su hgado y vescula biliar se observaron normales en la tomografa computarizada, pero es posible que deba realizar otras pruebas, como ecografas y [place], de forma [place].

## 2024-05-09 ENCOUNTER — Telehealth: Payer: Self-pay | Admitting: Emergency Medicine

## 2024-05-09 NOTE — Telephone Encounter (Signed)
 Called patient via interpreter # [phone] to inform of lab results and need for additional testing and follow up.  Would also like to discuss his follow up options in the community.  The interpreter left a message on VM asking him to call me back.

## 2024-05-10 ENCOUNTER — Telehealth: Payer: Self-pay | Admitting: Emergency Medicine

## 2024-05-10 NOTE — Telephone Encounter (Signed)
 Called patient again with interpreter [phone].  He did answer and I explained the positive initial test for hepatitis C  I explained that he needs further testing to diagnose, that it is caused by a virus and can have long term health effects.  I also told him that there is a cure, so it would be best to complete the testing to see if he has it.  He does not have a doctor or insurance.  I gave him the number for [facility].  I also encouraged him to establish with [facility] or [place] [facility].  He says he will call for follow up.
